# Patient Record
Sex: Female | Born: 1970 | Race: White | Hispanic: No | Smoking: Former smoker
Health system: Southern US, Community
[De-identification: ages and names within clinical notes are randomized; demographics above are authoritative.]

## PROBLEM LIST (undated history)

## (undated) DIAGNOSIS — R87619 Unspecified abnormal cytological findings in specimens from cervix uteri: Secondary | ICD-10-CM

## (undated) DIAGNOSIS — R413 Other amnesia: Secondary | ICD-10-CM

## (undated) DIAGNOSIS — R32 Unspecified urinary incontinence: Secondary | ICD-10-CM

## (undated) DIAGNOSIS — F419 Anxiety disorder, unspecified: Secondary | ICD-10-CM

## (undated) DIAGNOSIS — F324 Major depressive disorder, single episode, in partial remission: Secondary | ICD-10-CM

## (undated) DIAGNOSIS — F909 Attention-deficit hyperactivity disorder, unspecified type: Secondary | ICD-10-CM

## (undated) DIAGNOSIS — F840 Autistic disorder: Secondary | ICD-10-CM

## (undated) DIAGNOSIS — T7421XA Adult sexual abuse, confirmed, initial encounter: Secondary | ICD-10-CM

## (undated) DIAGNOSIS — F329 Major depressive disorder, single episode, unspecified: Secondary | ICD-10-CM

## (undated) DIAGNOSIS — D497 Neoplasm of unspecified behavior of endocrine glands and other parts of nervous system: Secondary | ICD-10-CM

## (undated) DIAGNOSIS — A770 Spotted fever due to Rickettsia rickettsii: Secondary | ICD-10-CM

## (undated) DIAGNOSIS — E039 Hypothyroidism, unspecified: Secondary | ICD-10-CM

## (undated) DIAGNOSIS — F411 Generalized anxiety disorder: Secondary | ICD-10-CM

## (undated) DIAGNOSIS — D649 Anemia, unspecified: Secondary | ICD-10-CM

## (undated) DIAGNOSIS — E079 Disorder of thyroid, unspecified: Secondary | ICD-10-CM

## (undated) DIAGNOSIS — F32A Depression, unspecified: Secondary | ICD-10-CM

## (undated) DIAGNOSIS — K900 Celiac disease: Secondary | ICD-10-CM

## (undated) HISTORY — DX: Anemia, unspecified: D64.9

## (undated) HISTORY — DX: Depression, unspecified: F32.A

## (undated) HISTORY — DX: Hypothyroidism, unspecified: E03.9

## (undated) HISTORY — DX: Major depressive disorder, single episode, in partial remission: F32.4

## (undated) HISTORY — DX: Other amnesia: R41.3

## (undated) HISTORY — DX: Disorder of thyroid, unspecified: E07.9

## (undated) HISTORY — DX: Spotted fever due to Rickettsia rickettsii: A77.0

## (undated) HISTORY — DX: Autistic disorder: F84.0

## (undated) HISTORY — DX: Neoplasm of unspecified behavior of endocrine glands and other parts of nervous system: D49.7

## (undated) HISTORY — DX: Unspecified abnormal cytological findings in specimens from cervix uteri: R87.619

## (undated) HISTORY — DX: Attention-deficit hyperactivity disorder, unspecified type: F90.9

## (undated) HISTORY — DX: Anxiety disorder, unspecified: F41.9

## (undated) HISTORY — DX: Unspecified urinary incontinence: R32

## (undated) HISTORY — DX: Generalized anxiety disorder: F41.1

## (undated) HISTORY — DX: Celiac disease: K90.0

## (undated) HISTORY — DX: Adult sexual abuse, confirmed, initial encounter: T74.21XA

## (undated) HISTORY — DX: Major depressive disorder, single episode, unspecified: F32.9

---
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
## 2003-05-31 HISTORY — PX: GYNECOLOGIC CRYOSURGERY: SHX857

## 2007-05-31 DIAGNOSIS — T7421XA Adult sexual abuse, confirmed, initial encounter: Secondary | ICD-10-CM

## 2007-05-31 HISTORY — DX: Adult sexual abuse, confirmed, initial encounter: T74.21XA

## 2017-03-07 DIAGNOSIS — K900 Celiac disease: Secondary | ICD-10-CM | POA: Insufficient documentation

## 2017-03-07 DIAGNOSIS — E038 Other specified hypothyroidism: Secondary | ICD-10-CM | POA: Insufficient documentation

## 2017-03-07 DIAGNOSIS — E063 Autoimmune thyroiditis: Secondary | ICD-10-CM | POA: Insufficient documentation

## 2017-04-13 DIAGNOSIS — M797 Fibromyalgia: Secondary | ICD-10-CM | POA: Insufficient documentation

## 2017-07-10 DIAGNOSIS — E559 Vitamin D deficiency, unspecified: Secondary | ICD-10-CM | POA: Insufficient documentation

## 2018-01-05 ENCOUNTER — Other Ambulatory Visit: Payer: Self-pay

## 2018-01-05 ENCOUNTER — Ambulatory Visit (INDEPENDENT_AMBULATORY_CARE_PROVIDER_SITE_OTHER): Payer: BLUE CROSS/BLUE SHIELD | Admitting: Obstetrics & Gynecology

## 2018-01-05 ENCOUNTER — Encounter: Payer: Self-pay | Admitting: Obstetrics & Gynecology

## 2018-01-05 ENCOUNTER — Other Ambulatory Visit (HOSPITAL_COMMUNITY)
Admission: RE | Admit: 2018-01-05 | Discharge: 2018-01-05 | Disposition: A | Discharge status: Home / Self Care | Payer: BLUE CROSS/BLUE SHIELD | Source: Ambulatory Visit | Attending: Obstetrics & Gynecology | Admitting: Obstetrics & Gynecology

## 2018-01-05 VITALS — BP 120/92 | HR 80 | Resp 16 | Ht 67.5 in | Wt 150.4 lb

## 2018-01-05 DIAGNOSIS — Z01419 Encounter for gynecological examination (general) (routine) without abnormal findings: Secondary | ICD-10-CM | POA: Diagnosis not present

## 2018-01-05 DIAGNOSIS — N6310 Unspecified lump in the right breast, unspecified quadrant: Secondary | ICD-10-CM | POA: Diagnosis not present

## 2018-01-05 DIAGNOSIS — Z202 Contact with and (suspected) exposure to infections with a predominantly sexual mode of transmission: Secondary | ICD-10-CM

## 2018-01-05 DIAGNOSIS — N920 Excessive and frequent menstruation with regular cycle: Secondary | ICD-10-CM

## 2018-01-05 DIAGNOSIS — Z205 Contact with and (suspected) exposure to viral hepatitis: Secondary | ICD-10-CM

## 2018-01-05 DIAGNOSIS — Z124 Encounter for screening for malignant neoplasm of cervix: Secondary | ICD-10-CM | POA: Insufficient documentation

## 2018-01-05 DIAGNOSIS — N852 Hypertrophy of uterus: Secondary | ICD-10-CM

## 2018-01-05 NOTE — Progress Notes (Signed)
Patient is scheduled for Bilateral Breast Diagnostic Mammogram and R Breast Ultrasound at [facility] on 01/10/18 at 1410 .  Requested female radiologist and mammography technician per patient request.
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
## 2018-01-06 LAB — HEPATITIS C ANTIBODY: HEP C VIRUS AB: 0.2 {s_co_ratio} (ref 0.0–0.9)

## 2018-01-06 LAB — HEP, RPR, HIV PANEL
HEP B S AG: NEGATIVE
HIV SCREEN 4TH GENERATION: NONREACTIVE
RPR: NONREACTIVE

## 2018-01-09 ENCOUNTER — Encounter: Payer: Self-pay | Admitting: Obstetrics & Gynecology

## 2018-01-09 LAB — CYTOLOGY - PAP
Chlamydia: NEGATIVE
Diagnosis: NEGATIVE
HPV: NOT DETECTED
Neisseria Gonorrhea: NEGATIVE
TRICH (WINDOWPATH): NEGATIVE

## 2018-01-10 ENCOUNTER — Ambulatory Visit
Admission: RE | Admit: 2018-01-10 | Discharge: 2018-01-10 | Disposition: A | Discharge status: Home / Self Care | Payer: BLUE CROSS/BLUE SHIELD | Source: Ambulatory Visit | Attending: Obstetrics & Gynecology | Admitting: Obstetrics & Gynecology

## 2018-01-10 ENCOUNTER — Other Ambulatory Visit: Payer: Self-pay | Admitting: Obstetrics & Gynecology

## 2018-01-10 DIAGNOSIS — N6310 Unspecified lump in the right breast, unspecified quadrant: Secondary | ICD-10-CM

## 2018-01-12 ENCOUNTER — Other Ambulatory Visit: Payer: BLUE CROSS/BLUE SHIELD

## 2018-07-16 ENCOUNTER — Other Ambulatory Visit: Payer: Self-pay | Admitting: Obstetrics & Gynecology

## 2018-07-16 ENCOUNTER — Ambulatory Visit
Admission: RE | Admit: 2018-07-16 | Discharge: 2018-07-16 | Disposition: A | Discharge status: Home / Self Care | Payer: BLUE CROSS/BLUE SHIELD | Source: Ambulatory Visit | Attending: Obstetrics & Gynecology | Admitting: Obstetrics & Gynecology

## 2018-07-16 DIAGNOSIS — N6310 Unspecified lump in the right breast, unspecified quadrant: Secondary | ICD-10-CM

## 2019-01-12 ENCOUNTER — Other Ambulatory Visit: Payer: Self-pay

## 2019-01-12 ENCOUNTER — Ambulatory Visit (HOSPITAL_COMMUNITY)
Admission: EM | Admit: 2019-01-12 | Discharge: 2019-01-12 | Disposition: A | Discharge status: Home / Self Care | Payer: BLUE CROSS/BLUE SHIELD | Source: Home / Self Care

## 2019-01-12 ENCOUNTER — Emergency Department (HOSPITAL_COMMUNITY)
Admission: EM | Admit: 2019-01-12 | Discharge: 2019-01-12 | Disposition: A | Discharge status: Home / Self Care | Payer: BLUE CROSS/BLUE SHIELD | Attending: Emergency Medicine | Admitting: Emergency Medicine

## 2019-01-12 ENCOUNTER — Emergency Department (HOSPITAL_COMMUNITY): Payer: BLUE CROSS/BLUE SHIELD

## 2019-01-12 DIAGNOSIS — Z5320 Procedure and treatment not carried out because of patient's decision for unspecified reasons: Secondary | ICD-10-CM | POA: Insufficient documentation

## 2019-01-12 DIAGNOSIS — Z79899 Other long term (current) drug therapy: Secondary | ICD-10-CM | POA: Diagnosis not present

## 2019-01-12 DIAGNOSIS — Z91040 Latex allergy status: Secondary | ICD-10-CM | POA: Diagnosis not present

## 2019-01-12 DIAGNOSIS — E038 Other specified hypothyroidism: Secondary | ICD-10-CM | POA: Insufficient documentation

## 2019-01-12 DIAGNOSIS — T7840XA Allergy, unspecified, initial encounter: Secondary | ICD-10-CM

## 2019-01-12 DIAGNOSIS — R0600 Dyspnea, unspecified: Secondary | ICD-10-CM | POA: Diagnosis present

## 2019-01-12 DIAGNOSIS — F410 Panic disorder [episodic paroxysmal anxiety] without agoraphobia: Secondary | ICD-10-CM | POA: Diagnosis not present

## 2019-01-12 DIAGNOSIS — R21 Rash and other nonspecific skin eruption: Secondary | ICD-10-CM | POA: Insufficient documentation

## 2019-01-12 MED ORDER — SODIUM CHLORIDE 0.9 % IV BOLUS: 1000.0000 mL | Freq: Once | INTRAVENOUS | Status: DC

## 2019-01-12 MED ORDER — DIPHENHYDRAMINE HCL 50 MG/ML IJ SOLN: 50.0000 mg | Freq: Once | INTRAMUSCULAR | Status: DC

## 2019-01-12 MED ORDER — LORAZEPAM 1 MG PO TABS: 1.0000 mg | ORAL_TABLET | Freq: Three times a day (TID) | ORAL | 0 refills | Status: DC | PRN

## 2019-01-12 MED ORDER — FAMOTIDINE IN NACL 20-0.9 MG/50ML-% IV SOLN: 20.0000 mg | Freq: Once | INTRAVENOUS | Status: DC

## 2019-01-12 MED ORDER — LORAZEPAM 2 MG/ML IJ SOLN: 1.0000 mg | Freq: Once | INTRAMUSCULAR | Status: DC

## 2019-01-12 NOTE — ED Notes (Addendum)
Pt brought to room via wheelchair from triage.  Pt crying and upset, Rolene Arbour, RN explained to pt that we needed to have her undress, pt st's she does not understand why.  This RN and Rolene Arbour RN explained to pt that we needed to put her on the cardiac monitor and get a IV started ref. Abnormal EKG and allergic reaction. We assured pt that we would provide privacy and held up a sheet while pt removed her shirt and bra.  When this nurse attempted to start IV pt st's she does not want it.  This RN explained to pt the importance of the IV and meds.  Pt again st's "I don't want it".  This RN asked pt if she was refusing and explained to pt that if she was that I could not do it against her will.  Pt again said I do not want it   At that time I spoke with Dr. Darl Householder and told him pt's wishes.  Dr. Darl Householder came into room and explained to pt that he thought she needed meds and would cancel lab work.  Pt stated to Dr. Darl Householder that she did not want it.  Dr. Darl Householder asked pt if he could at least give her meds via shot and not IV, pt again stated no.  Dr. Darl Householder then said he would suggest Benadryl, pt again said no.   I explained to pt that she would need to sign the AMA and pt agreed.  While removing blood pressure cuff and EKG leads, pt asked why we (the staff) was so pushy and pulling at her clothes, I explained to her that no one was pulling at her clothes and that this is the Emergency Dept and when someone like herself comes in with an allergic reaction and abnormal EKG we act very fast to ensure that we give her the best medical treatment that we can.  I assured her that her health was in our best interest and that we were only trying to care for her. I explained to her that I would go out and close the curtain so that she could get dressed.  When I went back into room to show her the way out, pt had already left the room

## 2019-01-12 NOTE — Discharge Instructions (Signed)
Take ativan as needed for panic attack   Take benadryl 25 mg every 6 hrs as needed for rash   See your doctor   Avoid vapes   You signed out against medical advice so we were unable to fully assess you   Return to ER if you have worse rash, shortness of breath, throat closing, chest pain
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
## 2019-01-12 NOTE — ED Provider Notes (Signed)
I initially was first to arrive to the room.  Patient was emergently placed back from waiting room for concerns of abnormal EKG and possible allergic reaction.  On my arrival, patient was being assisted to the bed by RN and tech.  Given concerns for possible abnormal EKG, staff was asked to place patient on cardiac monitor, which began immediately, while I reviewed her EKG.  EKG was given to emergency attending provider.  As I began to question patient regarding her history of present illness, she became very tearful and anxious and was not answering questions.  I attempted to gather further information from patient but was unsuccessful.  At this time, Dr. Darl Householder was at bedside and assumed care of patient.  I removed myself from room and did not participate in further care of this patient.   Portions of this note were generated with Lobbyist. Dictation errors may occur despite best attempts at proofreading.     Volanda Napoleon, PA-C 01/12/19 2137    Drenda Freeze, MD 01/12/19 2250
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
## 2019-01-12 NOTE — ED Notes (Signed)
Pt refusing all treatment st's she wants to go.  Dr. Darl Householder made aware and in to talk with pt.  Pt continues to refuse treatment.

## 2019-01-15 ENCOUNTER — Other Ambulatory Visit: Payer: BLUE CROSS/BLUE SHIELD

## 2019-05-03 ENCOUNTER — Telehealth: Payer: Self-pay

## 2019-05-03 ENCOUNTER — Encounter: Payer: Self-pay | Admitting: Obstetrics & Gynecology

## 2019-05-03 NOTE — Telephone Encounter (Signed)
Pt taken out of recall. Letter sent mailed and via [facility] per Dr Sabra Heck.  Will close encounter .

## 2019-05-03 NOTE — Telephone Encounter (Signed)
Tried calling patient regarding recall for breast imaging. No answer, and unable to leave a message as the patient's voicemail box is full.

## 2019-07-02 DIAGNOSIS — F4310 Post-traumatic stress disorder, unspecified: Secondary | ICD-10-CM | POA: Insufficient documentation

## 2019-10-16 ENCOUNTER — Encounter: Payer: Self-pay | Admitting: Obstetrics & Gynecology

## 2019-10-22 DIAGNOSIS — E782 Mixed hyperlipidemia: Secondary | ICD-10-CM | POA: Insufficient documentation

## 2019-10-24 LAB — COLOGUARD

## 2019-10-31 LAB — COLOGUARD: COLOGUARD: NEGATIVE

## 2019-10-31 LAB — EXTERNAL GENERIC LAB PROCEDURE: COLOGUARD: NEGATIVE
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
## 2019-11-11 ENCOUNTER — Other Ambulatory Visit: Payer: Self-pay

## 2019-11-12 ENCOUNTER — Telehealth: Payer: Self-pay | Admitting: *Deleted

## 2019-11-12 ENCOUNTER — Ambulatory Visit (INDEPENDENT_AMBULATORY_CARE_PROVIDER_SITE_OTHER): Payer: BC Managed Care – PPO | Admitting: Obstetrics & Gynecology

## 2019-11-12 ENCOUNTER — Encounter: Payer: Self-pay | Admitting: Obstetrics & Gynecology

## 2019-11-12 VITALS — BP 116/80 | HR 68 | Temp 97.0°F | Resp 16 | Ht 67.5 in | Wt 143.0 lb

## 2019-11-12 DIAGNOSIS — N921 Excessive and frequent menstruation with irregular cycle: Secondary | ICD-10-CM | POA: Diagnosis not present

## 2019-11-12 DIAGNOSIS — I8390 Asymptomatic varicose veins of unspecified lower extremity: Secondary | ICD-10-CM

## 2019-11-12 DIAGNOSIS — Z01419 Encounter for gynecological examination (general) (routine) without abnormal findings: Secondary | ICD-10-CM

## 2019-11-12 DIAGNOSIS — N6310 Unspecified lump in the right breast, unspecified quadrant: Secondary | ICD-10-CM

## 2019-11-12 NOTE — Telephone Encounter (Signed)
-----   Message from Megan Salon, MD sent at 11/12/2019 11:19 AM EDT ----- Regarding: follow up MMG Charlotte Jimenez, This pt did not go for her breast follow up.  She is ready to do it now.  Can you please call and see what needs to be ordered.  She does need bilateral screening and follow up.  Thanks.  Vinnie Level

## 2019-11-12 NOTE — Telephone Encounter (Signed)
Per review of Epic, right breast US on 07/16/18.  IMPRESSION: Stable likely benign complex cyst or clustered cysts in the INNER RIGHT breast.  RECOMMENDATION: RIGHT breast ultrasound at time of annual BILATERAL diagnostic mammography in 6 months.     Order placed for bilateral DX MMG and right breast US.   Call placed to [facility], spoke with [facility]. Patient scheduled for bilateral Dx MMG and right breast US, if needed, on 11/15/19 at 1:40pm, arrive at 1:20pm.   Placed in MMG hold.   Call placed to patient, left detailed message, ok per dpr, name identified on voicemail.  Advised of appt as seen above. Advised if any changes need to be made to appt, contact [facility] directly at [phone] to reschedule. Return call to office if any additional questions.   Routing to provider for final review. Patient is agreeable to disposition. Will close encounter.

## 2019-11-13 ENCOUNTER — Telehealth: Payer: Self-pay | Admitting: Obstetrics & Gynecology

## 2019-11-13 NOTE — Telephone Encounter (Signed)
Call to patient. Per DPR, OK to leave message on voicemail.   Left voicemail requesting a return call to [facility] to review benefits and schedule recommended Pelvic ultrasound with Felipa Emory, MD

## 2019-11-14 NOTE — Telephone Encounter (Signed)
Patient is returning call to [facility]. 

## 2019-11-14 NOTE — Telephone Encounter (Signed)
Spoke with patient regarding benefits for recommended ultrasound. Patient is aware that ultrasound is transvaginal. Patient acknowledges understanding of information presented. Patient is aware of cancellation policy. Patient scheduled appointment for 12/05/2019 at 0300PM with M. Edwinna Areola, MD. Encounter closed.

## 2019-11-14 NOTE — Telephone Encounter (Signed)
Call to patient. Per DPR, OK to leave message on voicemail.  Left voicemail requesting a return call to [facility] to review benefits and schedule recommended Pelvic ultrasound with Felipa Emory, MD

## 2019-11-15 ENCOUNTER — Other Ambulatory Visit: Payer: BLUE CROSS/BLUE SHIELD

## 2019-11-21 ENCOUNTER — Ambulatory Visit
Admission: RE | Admit: 2019-11-21 | Discharge: 2019-11-21 | Disposition: A | Discharge status: Home / Self Care | Payer: BLUE CROSS/BLUE SHIELD | Source: Ambulatory Visit | Attending: Obstetrics & Gynecology | Admitting: Obstetrics & Gynecology

## 2019-11-21 ENCOUNTER — Other Ambulatory Visit: Payer: Self-pay

## 2019-11-21 DIAGNOSIS — N6310 Unspecified lump in the right breast, unspecified quadrant: Secondary | ICD-10-CM

## 2019-12-05 ENCOUNTER — Ambulatory Visit (INDEPENDENT_AMBULATORY_CARE_PROVIDER_SITE_OTHER): Payer: BC Managed Care – PPO | Admitting: Obstetrics & Gynecology

## 2019-12-05 ENCOUNTER — Encounter: Payer: Self-pay | Admitting: Obstetrics & Gynecology

## 2019-12-05 ENCOUNTER — Other Ambulatory Visit: Payer: Self-pay

## 2019-12-05 ENCOUNTER — Ambulatory Visit (INDEPENDENT_AMBULATORY_CARE_PROVIDER_SITE_OTHER): Payer: BC Managed Care – PPO

## 2019-12-05 VITALS — BP 110/76 | HR 68 | Resp 16 | Wt 143.0 lb

## 2019-12-05 DIAGNOSIS — N921 Excessive and frequent menstruation with irregular cycle: Secondary | ICD-10-CM

## 2019-12-05 DIAGNOSIS — D582 Other hemoglobinopathies: Secondary | ICD-10-CM

## 2019-12-05 DIAGNOSIS — I8390 Asymptomatic varicose veins of unspecified lower extremity: Secondary | ICD-10-CM | POA: Diagnosis not present

## 2019-12-05 NOTE — Progress Notes (Signed)
49 y.o. [id number] Legally Separated White or Caucasian female here for pelvic ultrasound due to enlarged uterus and menorrhagia.  Pt really does not want to do anything for treatment of menorrhagia if possible.  Has hx of varicose veins of LEs that cause a lot of pain at times.  Desires to see provider for assessment and possible treatment.  Patient's last menstrual period was 11/21/2019 (exact date).  Contraception: not SA  Findings:  UTERUS: 8.7 x 5.8 x 5.1cm EMS: 9.45m ADNEXA: Left ovary: 3.4 x 1.7 x 1.6cm       Right ovary: 2.7 x 1.8 x 1.4cm with 1.1 x 14.5XMfollicle and irregular cystic structure within ovary, possible resolving corpus luteum  CUL DE SAC: no free fluid  Discussion:  Images reviewed with pt and findings discussed.  Ovary, specifically on right, discussed.  Feel structure is benign but due to irregular appearance, will repeat PUS in 3 months.  Without any additional endometrial finding, feel pt should proceed with endometrial biopsy.  She does not really want to do this so will plan to repeat PUS right after menses in 3 month.  If endometrium is not thin at that point, will plan biopsy at that time as well.  Order placed but pt is going to need to call with cycle onset for scheduling.    We have discussed possible iron infusion for treatment of anemia.  Pt had iron studies done as well.  These are not in Epic and she forgot to bring them today.  She is going to fax or send through [facility]  Assessment:  Menorrhagia with anemia Irregular cystic structure on right ovary Anemia LE varicose veins that cause pain  Plan:  Return for PUS after menses in 3 months.  If endometrium is not thin, plan endometrial biopsy as well.  Treatment options for bleeding discussed.  She does describe self as minimalist so does not want any hormonal therapy or surgical procedures. Pt is to get me copy of iron studies to see if there is indication for iron infusion. Referral to vascular surgeon  placed today at pt's request for specific office/provider  30 minutes spent with pt in discussion of follow up and options for treatment.

## 2019-12-08 ENCOUNTER — Encounter: Payer: Self-pay | Admitting: Obstetrics & Gynecology

## 2019-12-08 DIAGNOSIS — F902 Attention-deficit hyperactivity disorder, combined type: Secondary | ICD-10-CM | POA: Insufficient documentation

## 2019-12-08 DIAGNOSIS — D649 Anemia, unspecified: Secondary | ICD-10-CM | POA: Insufficient documentation

## 2019-12-08 DIAGNOSIS — D497 Neoplasm of unspecified behavior of endocrine glands and other parts of nervous system: Secondary | ICD-10-CM | POA: Insufficient documentation

## 2019-12-11 ENCOUNTER — Encounter: Payer: Self-pay | Admitting: Obstetrics & Gynecology

## 2019-12-12 ENCOUNTER — Telehealth: Payer: Self-pay

## 2019-12-12 NOTE — Telephone Encounter (Signed)
Attachments  [id number]  [id number]  [id number]  [id number]  [id number]   [facility] Pool I think this is what you asked for.

## 2019-12-12 NOTE — Telephone Encounter (Signed)
Patient was seen in office on 12/05/19.   Labs attached to MyChart message  printed and to Dr. Sabra Heck for review.

## 2020-01-02 NOTE — Telephone Encounter (Signed)
Can you let pt know that her labs showed low iron, low ferritin but she had no anemia.  Recommended 3 month recheck after she had been on supplemental iron.  She can do that here or with provider who did labs in May.  Should repeat prior to making any decisions about IV iron.  Thanks.

## 2020-01-03 NOTE — Telephone Encounter (Signed)
Left detailed message, ok per dpr, name identified on voicemail. Advised as seen below per Dr. Sabra Heck. Advised patient to return call to office to schedule lab appt if she desires to repeat labs at [facility] or if any additional questions.    Per review of labs in MyChart message, iron and ferritin collected on 10/16/19.   Encounter closed.

## 2020-03-05 ENCOUNTER — Telehealth: Payer: Self-pay

## 2020-03-05 DIAGNOSIS — N921 Excessive and frequent menstruation with irregular cycle: Secondary | ICD-10-CM

## 2020-03-05 NOTE — Telephone Encounter (Signed)
Patient started her cycle last night and calling to schedule ultrasound.

## 2020-03-05 NOTE — Telephone Encounter (Signed)
Spoke with pt. Pt states calling to say that she started her cycle last night 10/6. Pt states is suppose to have PUS, but didn't know what day of cycle or when after? Pt states has improved PMS sx this last month due to diet and exercise changes.  Pt advised will review with Dr Sabra Heck and return call to schedule PUS with possible EMB. Pt agreeable.   Routing to Dr Sabra Heck, Please advise on PUS and what day of cycle.      Per OV notes 12/05/19: Plan:  Return for PUS after menses in 3 months.  If endometrium is not thin, plan endometrial biopsy as well.  Treatment options for bleeding discussed.

## 2020-03-06 ENCOUNTER — Telehealth: Payer: Self-pay

## 2020-03-06 NOTE — Telephone Encounter (Signed)
Left detailed message. Pt to return call to triage RN to schedule PUS on 03/12/20 per Dr Sabra Heck.

## 2020-03-06 NOTE — Telephone Encounter (Signed)
Patient returned call

## 2020-03-06 NOTE — Telephone Encounter (Signed)
I'd like to do this right after her cycle ends so next week Thursday would be good for this.  Thanks.

## 2020-03-06 NOTE — Telephone Encounter (Signed)
Spoke with pt. Pt given recommendations per Dr Sabra Heck. Pt agreeable and verbalized understanding. Pt scheduled for PUS per Dr Sabra Heck on 10/14 at 0930 am and OV to follow. Pt agreeable to date and time of appt. Cancellation policy reviewed.  Cc: Rosa for precert  Orders placed  Encounter closed

## 2020-03-06 NOTE — Telephone Encounter (Signed)
Call placed to convey benefits for-------. Spoke with the patient and conveyed the benefits. Patient understands/agreeable with the benefits. Patient is aware of the cancellation policy. Appointment scheduled 03/12/20.

## 2020-03-12 ENCOUNTER — Encounter: Payer: Self-pay | Admitting: Obstetrics & Gynecology

## 2020-03-12 ENCOUNTER — Other Ambulatory Visit: Payer: Self-pay

## 2020-03-12 ENCOUNTER — Ambulatory Visit (INDEPENDENT_AMBULATORY_CARE_PROVIDER_SITE_OTHER): Payer: BC Managed Care – PPO

## 2020-03-12 ENCOUNTER — Other Ambulatory Visit (HOSPITAL_COMMUNITY)
Admission: RE | Admit: 2020-03-12 | Discharge: 2020-03-12 | Disposition: A | Discharge status: Home / Self Care | Payer: BC Managed Care – PPO | Source: Ambulatory Visit | Attending: Obstetrics & Gynecology | Admitting: Obstetrics & Gynecology

## 2020-03-12 ENCOUNTER — Ambulatory Visit (INDEPENDENT_AMBULATORY_CARE_PROVIDER_SITE_OTHER): Payer: BC Managed Care – PPO | Admitting: Obstetrics & Gynecology

## 2020-03-12 VITALS — BP 108/68 | HR 68 | Resp 16 | Wt 142.0 lb

## 2020-03-12 DIAGNOSIS — E611 Iron deficiency: Secondary | ICD-10-CM

## 2020-03-12 DIAGNOSIS — N920 Excessive and frequent menstruation with regular cycle: Secondary | ICD-10-CM | POA: Diagnosis present

## 2020-03-12 DIAGNOSIS — D351 Benign neoplasm of parathyroid gland: Secondary | ICD-10-CM | POA: Diagnosis not present

## 2020-03-12 DIAGNOSIS — N921 Excessive and frequent menstruation with irregular cycle: Secondary | ICD-10-CM

## 2020-03-12 NOTE — Addendum Note (Signed)
Addended by: Megan Salon on: 03/12/2020 03:14 PM   Modules accepted: Orders

## 2020-03-12 NOTE — Progress Notes (Signed)
49 y.o. Charlotte Jimenez Legally Separated White or Caucasian female here for pelvic ultrasound due to menorrhagia and thickened endoemtrium.  Pt just finished her cycle and is on the second day of no bleeding.  We discussed repeating her PUS to evaluate the endometrium.  She reports her fatigue is better.  She is on a natural iron supplement.  Has not had follow up iron level testing.  Last ferritin on 01/02/2020 was 5.    Separately, patient had thyroid ultrasound done in [place] about six months ago that showed a 13 x 48m nodule on the left lower thyroid with hypervascularity, consistent with an adenoma.  I wanted to make sure she'd had follow up for this.  States she's had trouble getting a referral.  PTH level was normal and was advised it should be biopsied.  She's uncomfortable with this.  D/w pt surgery referral.  She is very comfortable with this and referral will be placed to Dr. GHarlow Asa    Patient's last menstrual period was 03/04/2020 (exact date).  Contraception: abstinence  Findings:  UTERUS: 8.0 x 5.3 x 4.6cm EMS: 7.441m symmetric ADNEXA: Left ovary: 2.6 x 1.5 x 1.8cm with 11 x 1000LKeptated follicle       Right ovary: 2.3 x 1.5 x 1.6cm.  Both ovaries with normal blood flow CUL DE SAC: no free fluid  Discussion:  Ultrasonographer supervised.  Findings and images reviewed with pt.  Given endometrial thickness, proximity to menstrual cycle, feel should proceed with endometrial biopsy.  Consent obtained.  Procedure:  Speculum placed.  Cervix visualized and cleansed with betadine prep.  A single toothed tenaculum was applied to the anterior lip of the cervix.  Endometrial pipelle was advanced through the cervix into the endometrial cavity without difficulty.  Pipelle passed to 7cm.  Suction applied and pipelle removed with good tissue sample obtained.  Tenculum removed.  No bleeding noted.  Patient tolerated procedure well.  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 GerHarlow Asaaced regarding possible parathyroid adenoma.

## 2020-03-13 LAB — IRON,TIBC AND FERRITIN PANEL
Ferritin: 12 ng/mL — ABNORMAL LOW (ref 15–150)
Iron Saturation: 8 % — CL (ref 15–55)
Iron: 30 ug/dL (ref 27–159)
Total Iron Binding Capacity: 372 ug/dL (ref 250–450)
UIBC: 342 ug/dL (ref 131–425)

## 2020-03-13 LAB — CBC
Hematocrit: 39.5 % (ref 34.0–46.6)
Hemoglobin: 13.1 g/dL (ref 11.1–15.9)
MCH: 29.8 pg (ref 26.6–33.0)
MCHC: 33.2 g/dL (ref 31.5–35.7)
MCV: 90 fL (ref 79–97)
Platelets: 306 10*3/uL (ref 150–450)
RBC: 4.39 x10E6/uL (ref 3.77–5.28)
RDW: 12.1 % (ref 11.7–15.4)
WBC: 5.9 10*3/uL (ref 3.4–10.8)

## 2020-03-13 LAB — SPECIMEN STATUS REPORT

## 2020-03-13 LAB — SURGICAL PATHOLOGY

## 2020-03-13 LAB — CALCIUM: Calcium: 9.2 mg/dL (ref 8.7–10.2)

## 2020-04-10 NOTE — Telephone Encounter (Signed)
Patient had breast imaging 11/21/19. Okay to close encounter.

## 2020-04-27 ENCOUNTER — Other Ambulatory Visit: Payer: Self-pay | Admitting: General Surgery

## 2020-04-27 ENCOUNTER — Other Ambulatory Visit: Payer: Self-pay | Admitting: Surgery

## 2020-04-27 DIAGNOSIS — R221 Localized swelling, mass and lump, neck: Secondary | ICD-10-CM

## 2020-05-21 ENCOUNTER — Ambulatory Visit
Admission: RE | Admit: 2020-05-21 | Discharge: 2020-05-21 | Disposition: A | Discharge status: Home / Self Care | Payer: BC Managed Care – PPO | Source: Ambulatory Visit | Attending: Surgery | Admitting: Surgery

## 2020-05-21 DIAGNOSIS — R221 Localized swelling, mass and lump, neck: Secondary | ICD-10-CM

## 2020-10-14 ENCOUNTER — Encounter: Payer: Self-pay | Admitting: Neurology
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
## 2020-10-27 ENCOUNTER — Ambulatory Visit: Payer: Commercial Managed Care - PPO | Admitting: Neurology
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
## 2020-11-03 ENCOUNTER — Other Ambulatory Visit: Payer: Self-pay

## 2020-11-03 ENCOUNTER — Telehealth: Payer: Self-pay | Admitting: Neurology

## 2020-11-03 ENCOUNTER — Ambulatory Visit: Payer: Commercial Managed Care - PPO | Admitting: Neurology

## 2020-11-03 ENCOUNTER — Encounter: Payer: Self-pay | Admitting: Neurology

## 2020-11-03 VITALS — BP 138/96 | HR 93 | Ht 67.0 in | Wt 135.0 lb

## 2020-11-03 DIAGNOSIS — G43709 Chronic migraine without aura, not intractable, without status migrainosus: Secondary | ICD-10-CM

## 2020-11-03 DIAGNOSIS — G245 Blepharospasm: Secondary | ICD-10-CM | POA: Diagnosis not present

## 2020-11-03 DIAGNOSIS — G249 Dystonia, unspecified: Secondary | ICD-10-CM | POA: Diagnosis not present

## 2020-11-03 DIAGNOSIS — R259 Unspecified abnormal involuntary movements: Secondary | ICD-10-CM

## 2020-11-03 DIAGNOSIS — G255 Other chorea: Secondary | ICD-10-CM

## 2020-11-03 NOTE — Patient Instructions (Signed)
A referral to [facility] has been placed for your MRI someone will contact you directly to schedule your appt. They are located at [facility]. Please contact them directly by calling [phone] with any questions regarding your referral.   Benign Essential Blepharospasm, Adult Benign essential blepharospasm (BEB) is a nervous system condition that makes a person close his or her eyes without meaning to. Over time, episodes of this condition may gradually become more frequent and forceful, and eventually involve both eyes. This can make it hard for you to keep your eyes open and do activities such as watching television, driving, or reading. If this condition is not treated, the eyes may close forcefully for long periods of time. What are the causes? The exact cause of this condition is not known. The condition may be passed down through families with an abnormal gene. Symptoms of the condition may be triggered by:  The wind.  Sunlight or bright lights.  Noise.  Stress.  Fatigue.  Reading.  Driving.  Walking outside.  Air pollution.  Eye strain. What increases the risk? You are more likely to develop this condition if:  You are age 49 or older.  You have a family history of the condition.  You are female.  There are problems with the part of your brain that controls movements (basal ganglia).  You have a movement disorder called dystonia.  You have a history of eye diseases or trauma to the eye(s).  You take certain medicines, such as medicines that treat Parkinson disease. What are the signs or symptoms? The first symptom of this condition is frequent eye blinking or twitching that you cannot control. It may happen during the day and disappear at night. Other early symptoms include:  Eye dryness and irritation.  Eye irritation or pain from bright lights (photophobia). You may get temporary relief from your symptoms when you sing, yawn, chew, or  laugh. Later symptoms of this condition include:  Winking and squinting for longer than usual.  Muscle spasms in your tongue and jaw (Meigesyndrome).  Inability to keep your eyes open for long periods of time. Over time, symptoms may get stronger and last longer. How is this diagnosed? This condition is diagnosed based on your symptoms, your medical history, and a physical exam. How is this treated? There is no cure for this condition, but treatment can help with symptoms. Treatment options include:  Applying moisturizing eye drops (artificial tears) to the eye. These drops help to relieve eye irritation and dryness.  Getting an injection of botulinum toxin into the muscles that control eyelid movement. This treatment may need to be repeated every few months.  Taking medicines such as muscle relaxants and anti-anxiety medicines.  Having surgery to remove part of the eyelid muscles (myectomy). This may be done if injections of botulinum toxin do not work or they stop working.   Follow these instructions at home: Lifestyle  Wear tinted sunglasses that block UV (ultraviolet) light.  Wear eye protection outdoors on windy days.  Get enough sleep.  Try to manage or avoid stressful situations.  Do not watch TV or have screen time for long periods of time.  Avoid things that trigger your condition. General instructions  Learn as much as you can about your condition.  Work closely with your team of health care providers.  Use over-the-counter and prescription medicines, including eye drops, only as told by your health care provider.  Keep all follow-up visits as told by  your health care provider. This is important.  Do not drive if you are having an episode that affects how well you can see.  Keep your eyelids clean. Wash them daily with mild soap and water. This will help to prevent irritation and infection. Contact a health care provider if:  Your symptoms are not  controlled with treatment.  Your eyes are red, teary, or dry.  Your eyes droop.  You feel anxious or depressed. Get help right away if:  You cannot open your eyes. Summary  Benign essential blepharospasm (BEB) is a nervous system condition that makes a person close his or her eyes without meaning to.  The exact cause of this condition is not known. The condition may be passed down through families with an abnormal gene.  There is no cure for this condition, but treatment can help with symptoms. This information is not intended to replace advice given to you by your health care provider. Make sure you discuss any questions you have with your health care provider. Document Revised: 01/31/2020 Document Reviewed: 01/31/2020 Elsevier Patient Education  2021 [employer].

## 2020-11-03 NOTE — Telephone Encounter (Signed)
Lets see what her MRI shows and we can meet back together.  She was a little resistant to doing any workup and I had to virtually beg her just to do the MRI.  Agree that she may need some further labs but think that the diagnosis of blepharospasm/meige was good.  I'm sorry she was unhappy.

## 2020-11-04 NOTE — Telephone Encounter (Signed)
Called patient and left a message for a call back.  

## 2020-11-05 NOTE — Telephone Encounter (Signed)
Called patient and left a message for a call back.  

## 2020-11-05 NOTE — Telephone Encounter (Signed)
Patient called in and left a message returning Mahina's call

## 2020-11-06 NOTE — Telephone Encounter (Signed)
She is returning a call to [facility].

## 2020-11-06 NOTE — Telephone Encounter (Signed)
Called patient and left a message for a call back.  

## 2020-11-06 NOTE — Telephone Encounter (Signed)
Called patient back and she wanted to express her frustration with Dr. Carles Collet.  Patient stated that when she came in for her appointment she felt like Dr. Carles Collet did not listen at all and chose to stick to her own misunderstanding.   Patient stated that she was trying to explain explain to Dr. Carles Collet the timing of her symptoms. Patient stated that she doesn't want Dr. Carles Collet to focus on the blinking as it is one of my more mild symptoms. Patient stated that she was blinking more the day of her appointment because she was trying to make eye contact with Dr. Carles Collet and not appear to be rude, so her forcing eye contact made her blink more. Therefore, patient stated that the blinking isn't the first symptom and that she disagrees with the timeline Dr. Carles Collet put in her chart because her symptoms start in her cheeks and Jaw first.   Patient stated that the weird movement with her mouth causes her to choke and that she doesn't have a swallowing issues. Patient explained that the "suctioning" begins on the side back part of her tongue and if she has food in her mouth that "suctioning" will suck the food back into her windpipe causing her to choke. Therefore, patient stated that she does not have a swallowing issue and swallows just fine. It's just that the suctioning movement in her mouth causes her to choke.  Patient stated that she was asked if she sees the movements or twitching in her cheeks and Jaw and stated that she tried to explain to Dr. Carles Collet that she doesn't look at herself in the mirror so she doesn't see it. Patient stated that she doesn't spend looking at herself in the mirror but patient feels it and it moves. Patient said that she sees people staring at her in public when it happens so she knows it is visible.   Patient again explained that Dr. Carles Collet Choose to not listen and mocked her when she was telling her about the choking, and it was such a horrible experience. Patient stated that "Dr. Carles Collet responded in a way that  had nothing to do with what I just said and didn't like the recommendations about therapy for swallowing. She choose to ignore the fact that I corrected her and insisted that I have trouble swallowing food and that is not the case."   Also, patient stated that she was only reluctant to the MRI because doesn't like contrast dye. Patient stated that Dr,. Tat takes a couple of words and twist it up to nothing the patient said.  Patient stated that she originally wanted to See Dr. Delice Lesch but decided to give Dr. Carles Collet a chance regardless of the bad reviews she had. Patient stated that she should have listened to her instinct and not see Dr. Carles Collet. Patient stated that she was too uncomfortable to explain things to Dr. Carles Collet because she felt like she was not listening, so patient lost her confidence to communicate with her.   Before hanging up patient wanted to ensure that she wasn't fully blaming Dr. Carles Collet and that her forcing her eye contact with Dr. Carles Collet made her blink more so she could see why she thought the blinking was the first symptom when it isn't. Patient did mention she is looking for another doctor.  I apologized to patient for her bad experience and informed her that I would send Dr. Carles Collet a message and give her a call once I hear back. Patient is  aware that Dr. Carles Collet is on vacation and will be back on Monday.

## 2020-11-08 NOTE — Telephone Encounter (Signed)
I'm sorry she feels that way.  I tried to listen intently to what she was telling me.  I'm glad she still wants to pursue neuro care (even if elsewhere) as I feel that she needs that and, as mentioned, was going to do other things after her MRI.  Since she plans to seek care elsewhere and has lost trust in me unfortunately, will she still be pursuing the MRI?

## 2020-11-09 NOTE — Telephone Encounter (Signed)
MRI cancelled and noted.

## 2020-11-09 NOTE — Telephone Encounter (Signed)
I did speak with patient and thanked me for calling her back. She will pursue her care elsewhere, MRI will be done at the next Neurologist she goes to.

## 2020-11-09 NOTE — Telephone Encounter (Signed)
Voicemail at  [phone] 11/09/2020 will call back

## 2020-11-10 IMAGING — US US BREAST*R* LIMITED INC AXILLA
1 series · 6 of 6 positions shown · non-contrast
Comparison: Previous exam(s).

CLINICAL DATA: 49-year-old female for 2 year follow-up of probable
INNER RIGHT breast mass/cyst. Also for annual bilateral mammogram.

EXAM:
DIGITAL DIAGNOSTIC BILATERAL MAMMOGRAM WITH CAD AND TOMO
ULTRASOUND RIGHT BREAST

[Series 1: us breast*right* limited inc axilla · 0.05mm/px · 6 of 6 slices shown]
[im 1/6]
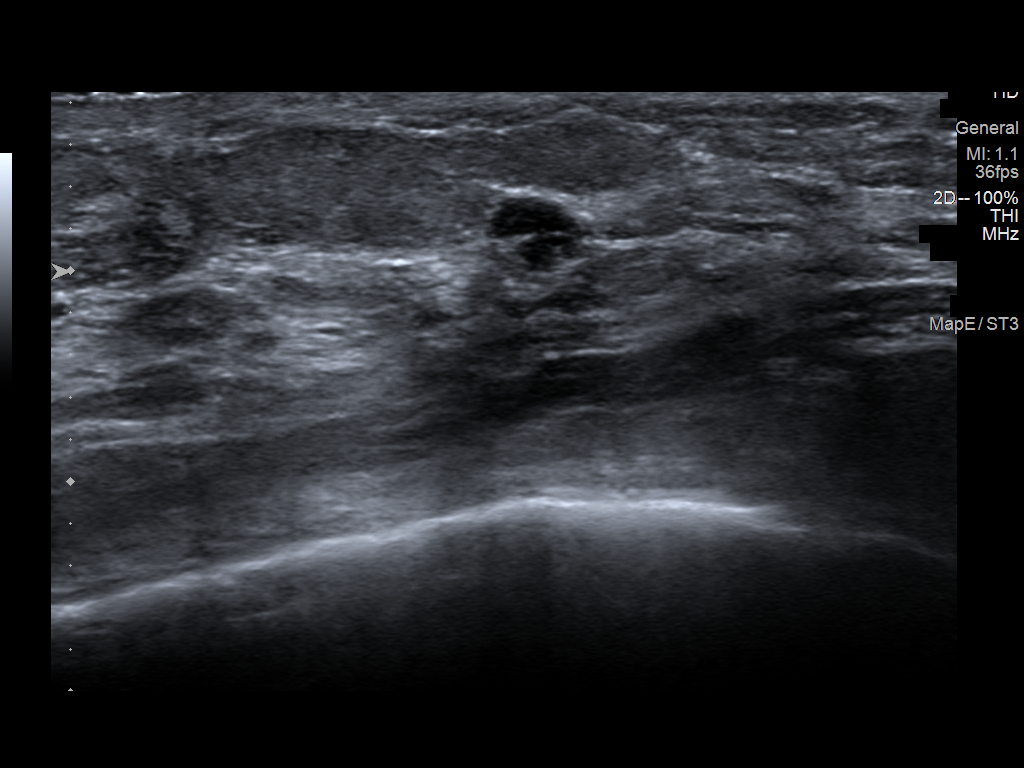
[im 2/6]
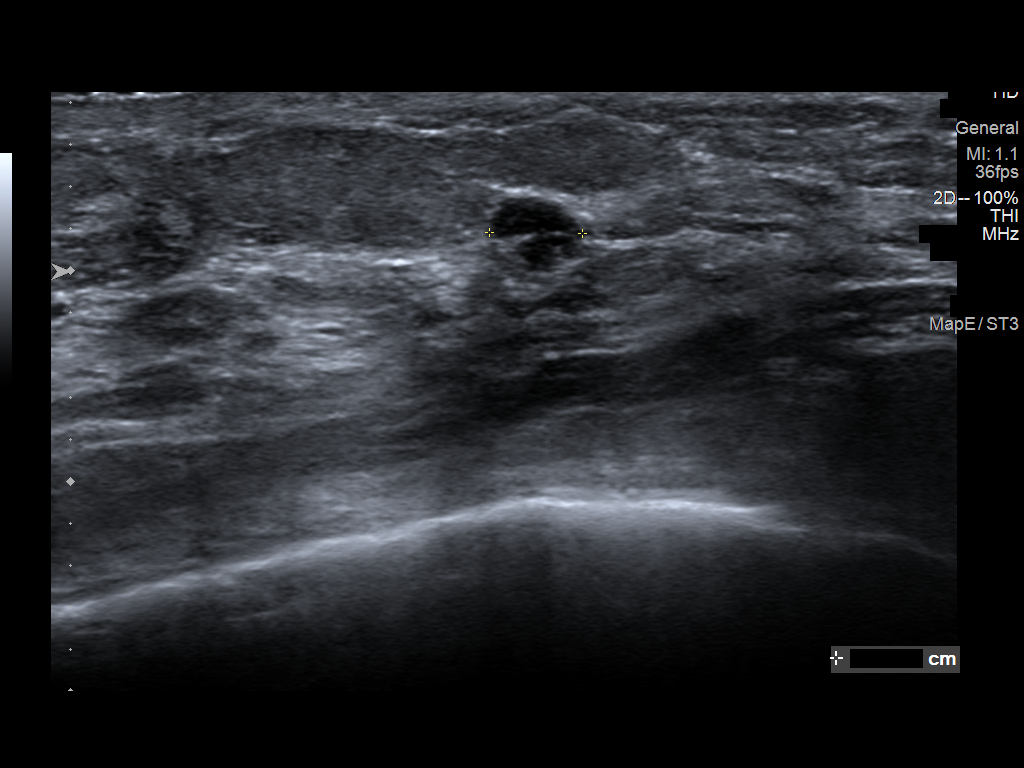
[im 3/6]
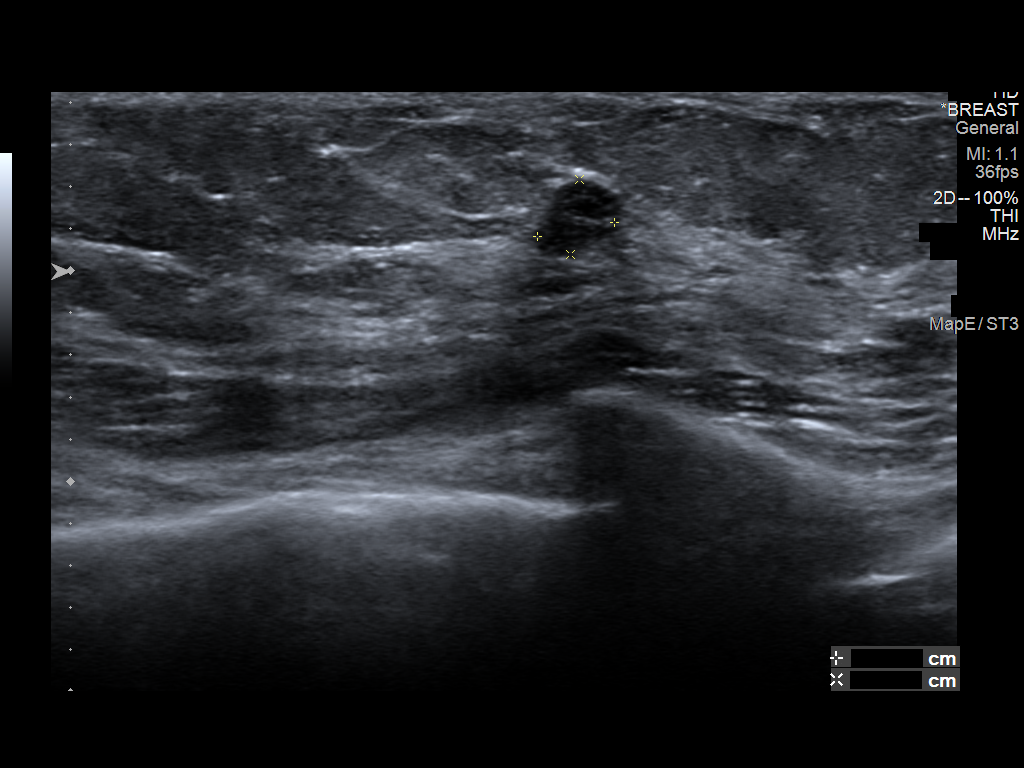
[im 4/6]
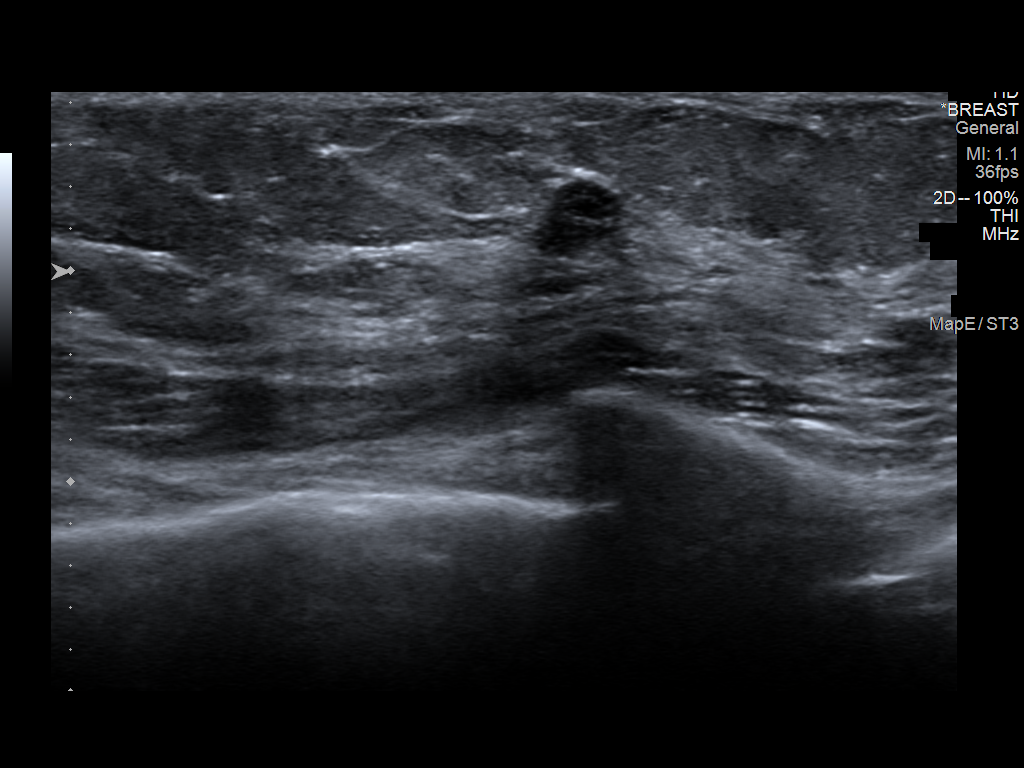
[im 5/6]
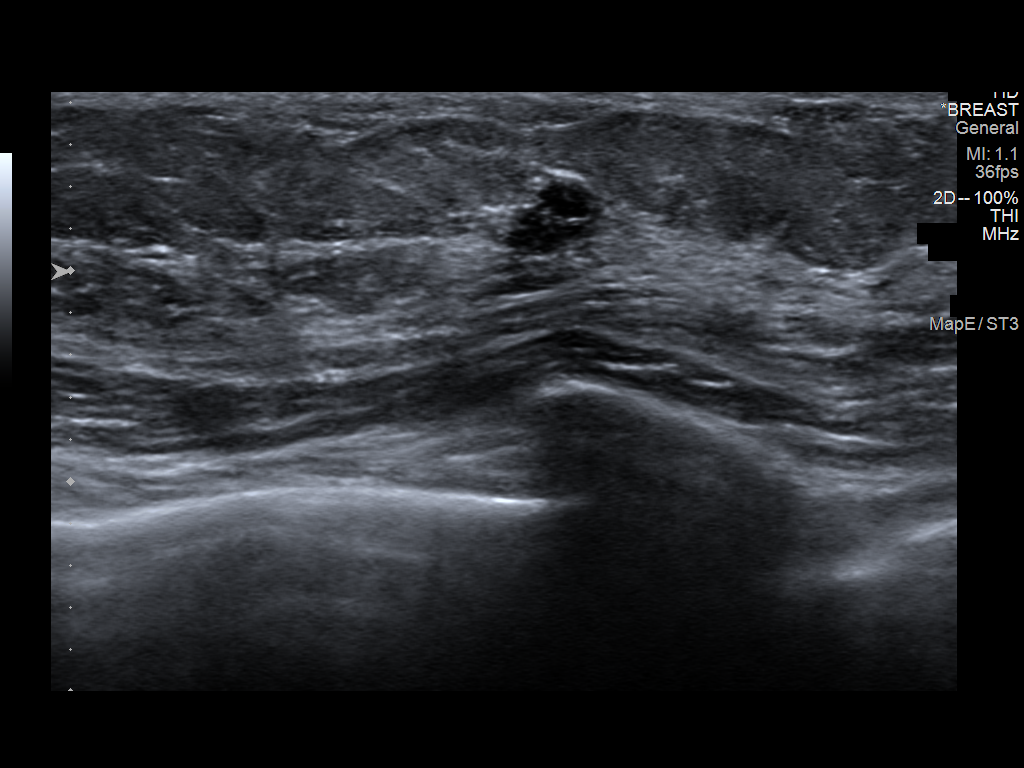
[im 6/6]
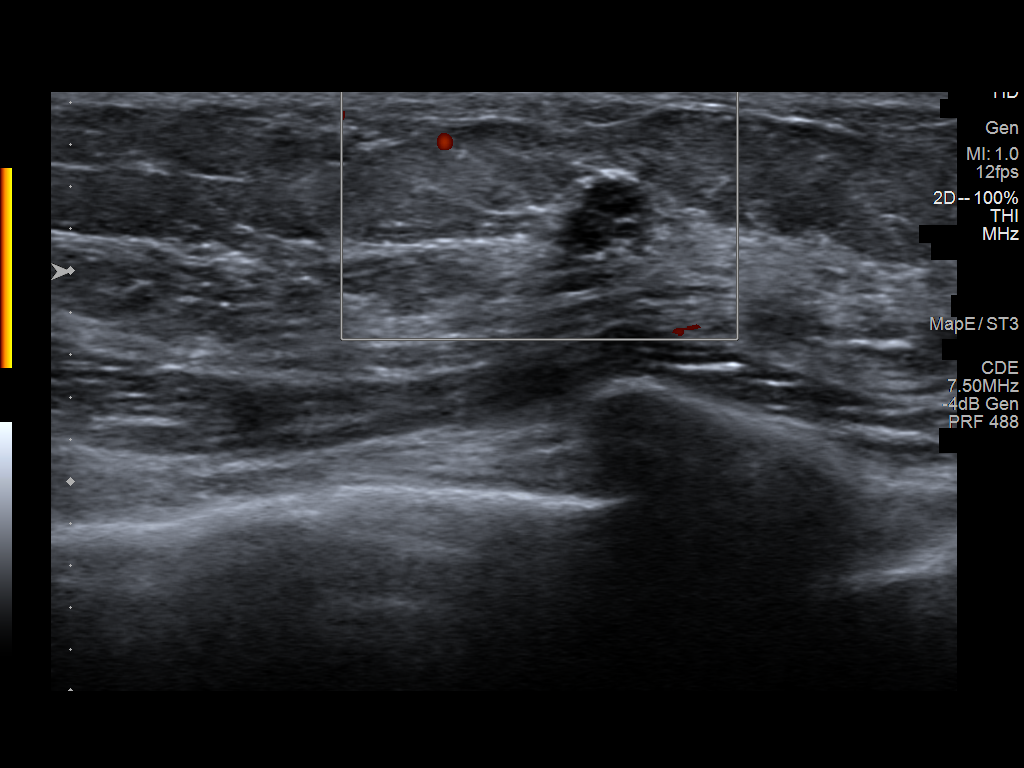

[6 of 6 positions shown; findings below may reference images not displayed]

ACR Breast Density Category c: The breast tissue is heterogeneously
dense, which may obscure small masses.
FINDINGS: 2D/3D full field views of both breasts demonstrate no suspicious
mass, distortion or worrisome calcifications. The previously
identified RIGHT breast masses are difficult to visualized today.

Mammographic images were processed with CAD.

Targeted ultrasound is performed, showing a 0.4 x 0.4 x 0.4 cm
circumscribed oval very hypoechoic mass at the 3 o'clock position of
the RIGHT breast 1 cm from the nipple, previously measuring 0.4 x
0.4 x 0.6 cm in 5510.
IMPRESSION: 1. Slightly decreased size of RIGHT breast mass/complicated cyst
compatible with a benign process.
2. No suspicious mammographic findings within either breast.

RECOMMENDATION:
Bilateral screening mammogram in 1 year.

I have discussed the findings and recommendations with the patient.
If applicable, a reminder letter will be sent to the patient
regarding the next appointment.

BI-RADS CATEGORY  2: Benign.

## 2020-11-11 ENCOUNTER — Encounter: Payer: Self-pay | Admitting: Neurology

## 2020-11-12 ENCOUNTER — Telehealth: Payer: Self-pay

## 2020-11-12 NOTE — Telephone Encounter (Signed)
MRI was cancelled and double checked with [facility] manager.

## 2020-11-18 ENCOUNTER — Telehealth: Payer: Self-pay | Admitting: Neurology

## 2020-11-18 NOTE — Telephone Encounter (Signed)
Patient dismissed from [facility] by Wells Guiles Tat, DO, effective 11/11/20. Dismissal Letter sent out by 1st class mail. [employer]

## 2021-01-14 ENCOUNTER — Ambulatory Visit: Payer: BC Managed Care – PPO

## 2021-05-11 IMAGING — US US THYROID
1 series · 13 of 25 positions shown · non-contrast
Comparison: None.

CLINICAL DATA: Other. History of Ferien[place] and left-sided thyroid
nodule.

EXAM:
THYROID ULTRASOUND
TECHNIQUE: Ultrasound examination of the thyroid gland and adjacent soft
tissues was performed.

[Series 1: us thyroid · 0.05mm/px · 13 of 38 slices shown]
[im 1/38]
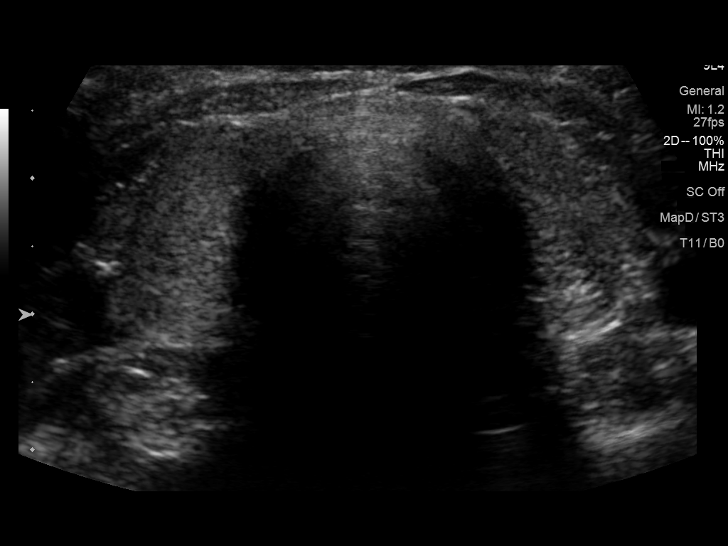
[im 4/38]
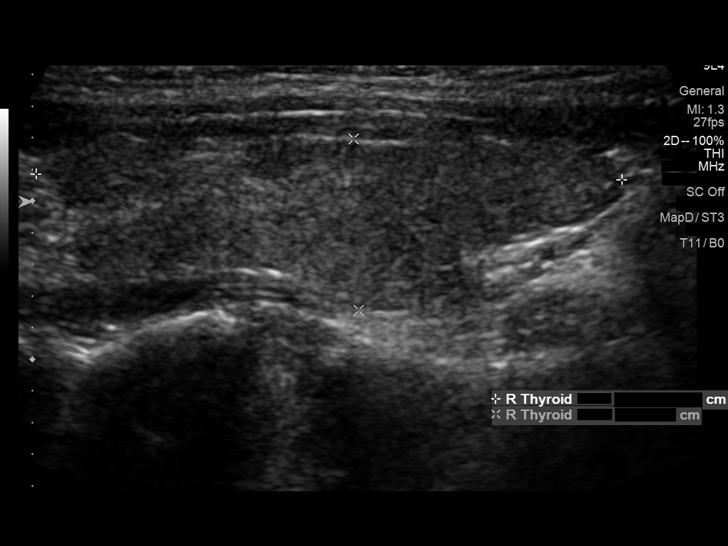
[im 7/38]
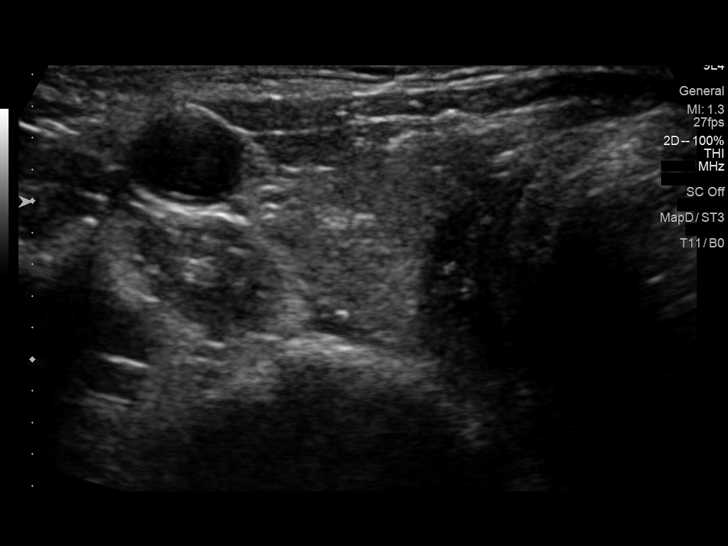
[im 10/38]
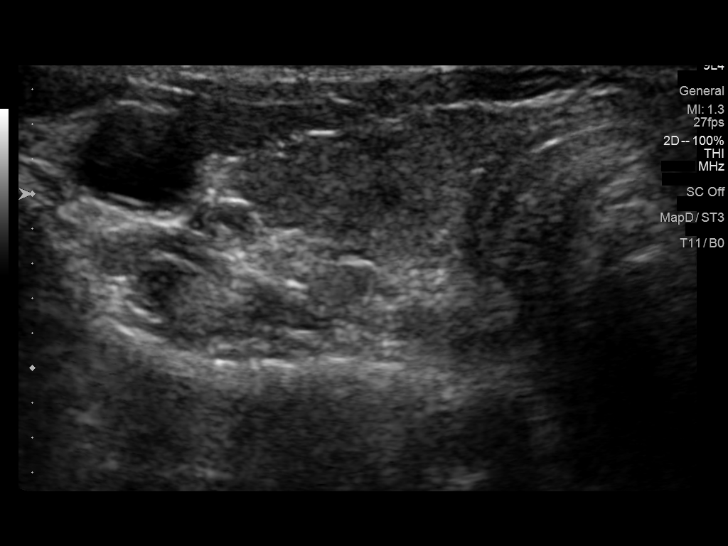
[im 13/38]
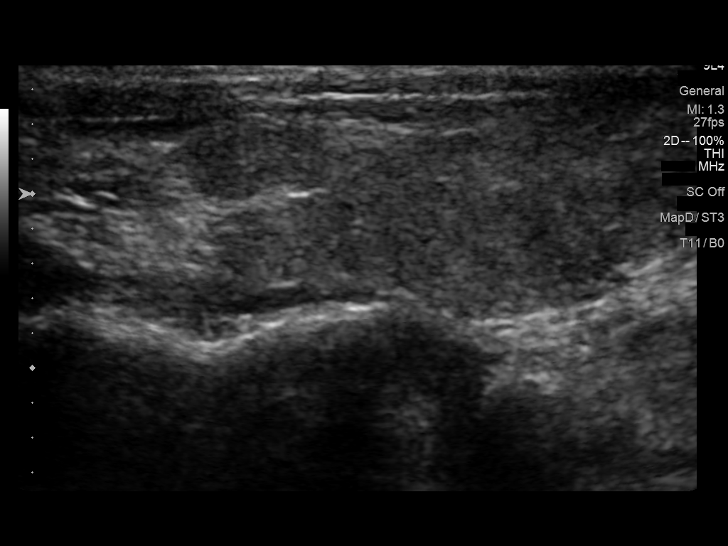
[im 16/38]
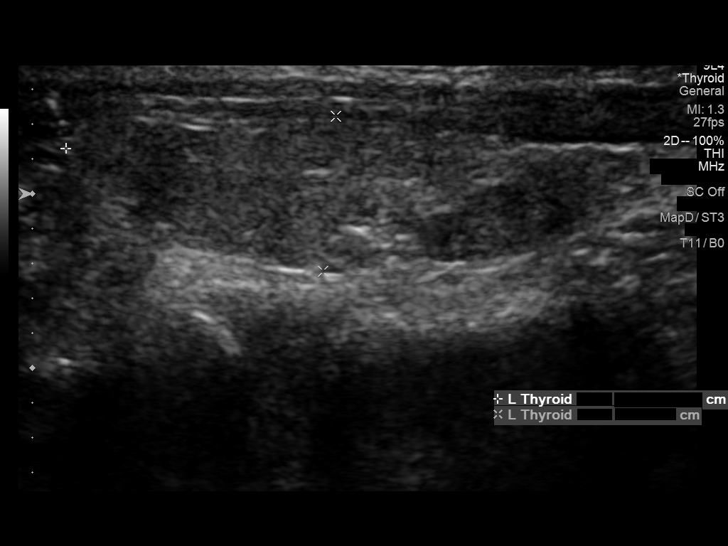
[im 19/38]
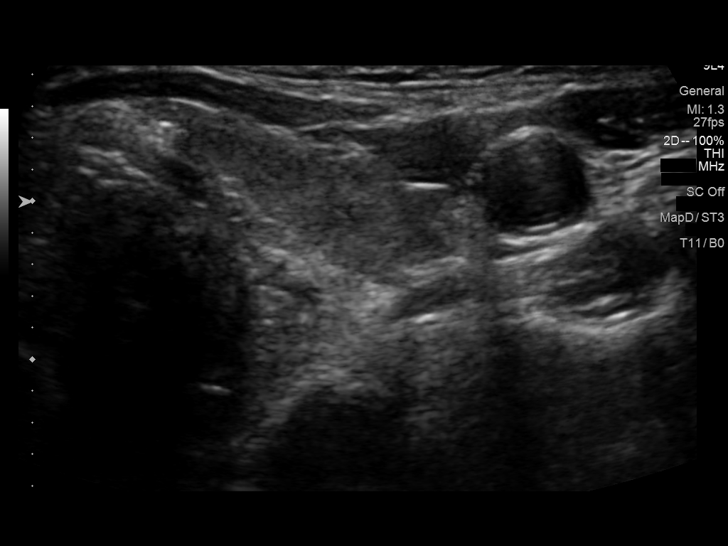
[im 22/38]
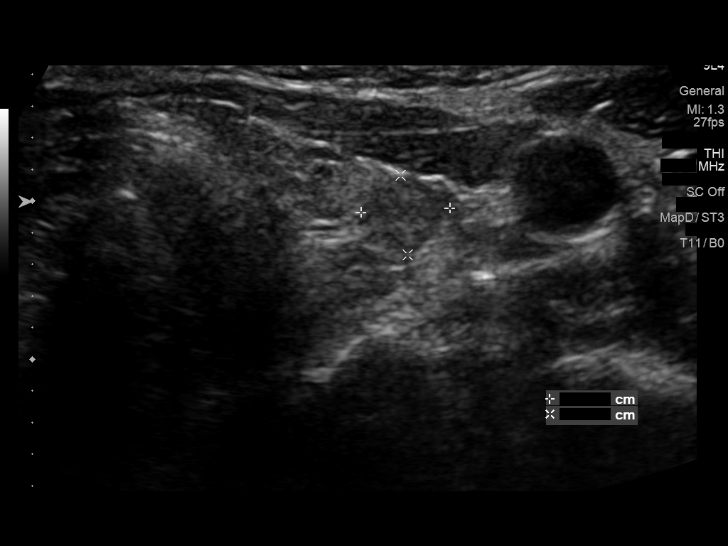
[im 25/38]
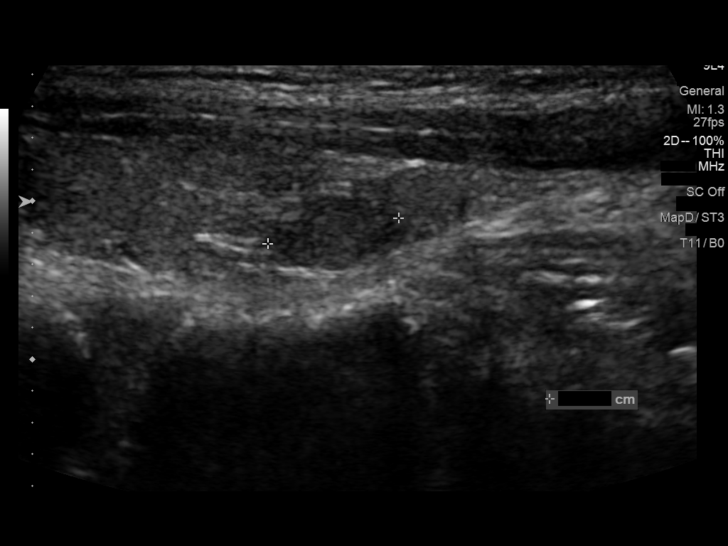
[im 28/38]
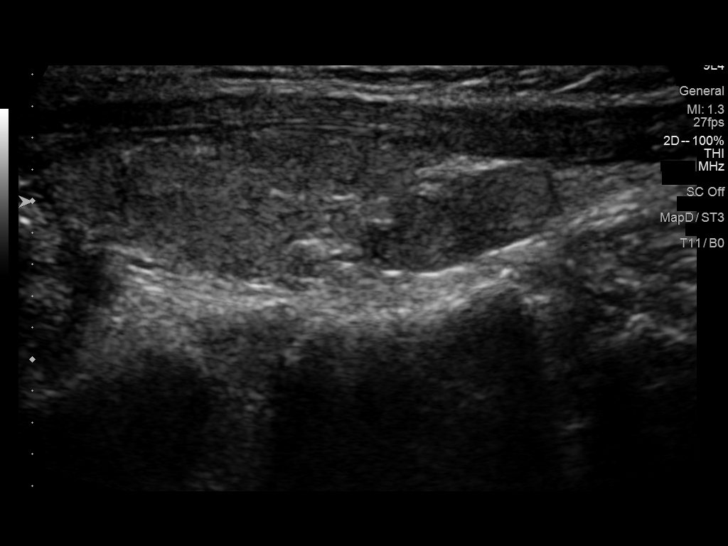
[im 31/38]
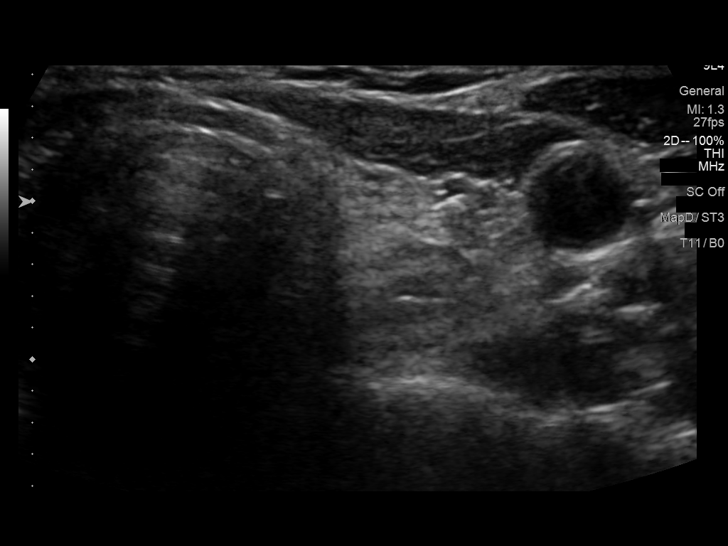
[im 34/38]
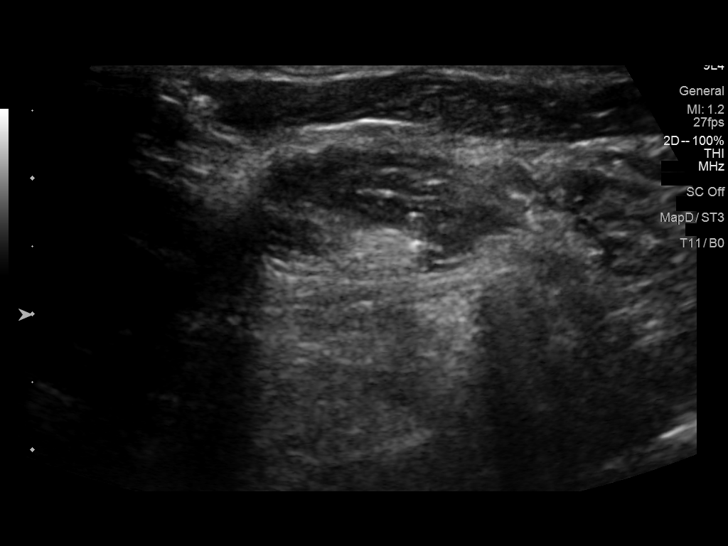
[im 38/38]
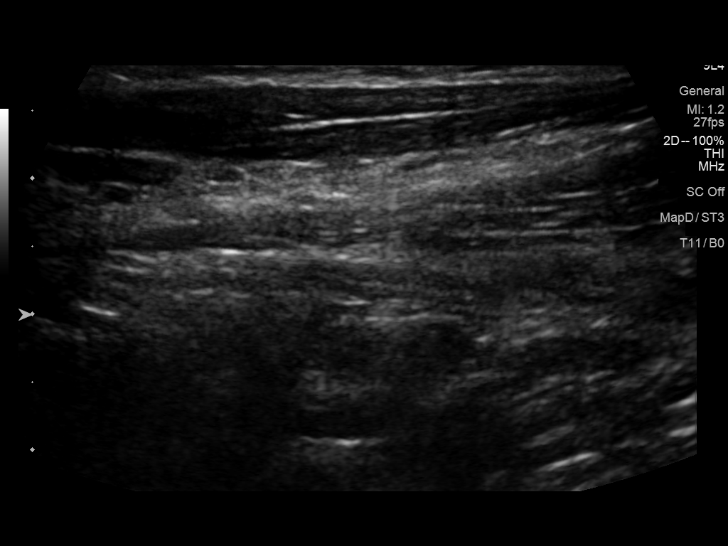

[13 of 25 positions shown; findings below may reference images not displayed]

FINDINGS: Parenchymal Echotexture: Moderately heterogenous - potential mild
diffuse glandular hyperemia (images 6 and 18)

Isthmus: Normal in size measuring 0.4 cm in diameter

Right lobe: Slightly atrophic in size measuring 3.7 x 1.7 x 1.5 cm

Left lobe: Slightly atrophic in size measuring 3.4 x 0.9 x 1.2 cm

_________________________________________________________

Estimated total number of nodules >/= 1 cm: 0

Number of spongiform nodules >/=  2 cm not described below (TR1): 0

Number of mixed cystic and solid nodules >/= 1.5 cm not described
below (TR2): 0

_________________________________________________________

There are two adjacent punctate (sub 0.8 cm) isoechoic ill-defined
nodules/pseudo nodules within the inferior pole the left lobe of the
thyroid, neither of which meet imaging criteria to recommend
percutaneous sampling or continued dedicated follow-up.
IMPRESSION: 1. Slightly atrophic, moderately heterogeneous and potentially
hyperemic thyroid without worrisome nodule or mass.
2. Punctate (sub 0.8 cm) isoechoic ill-defined nodules/pseudo
nodules within the left lobe of the thyroid do not meet criteria to
recommend percutaneous sampling or continued dedicated follow-up.

The above is in keeping with the ACR TI-RADS recommendations - [HOSPITAL] 2507;[DATE].

## 2021-10-07 ENCOUNTER — Ambulatory Visit (INDEPENDENT_AMBULATORY_CARE_PROVIDER_SITE_OTHER): Payer: Commercial Managed Care - PPO | Admitting: Obstetrics & Gynecology

## 2021-10-07 ENCOUNTER — Other Ambulatory Visit (HOSPITAL_COMMUNITY)
Admission: RE | Admit: 2021-10-07 | Discharge: 2021-10-07 | Disposition: A | Discharge status: Home / Self Care | Payer: Commercial Managed Care - PPO | Source: Ambulatory Visit | Attending: Obstetrics & Gynecology | Admitting: Obstetrics & Gynecology

## 2021-10-07 ENCOUNTER — Encounter (HOSPITAL_BASED_OUTPATIENT_CLINIC_OR_DEPARTMENT_OTHER): Payer: Self-pay | Admitting: Obstetrics & Gynecology

## 2021-10-07 VITALS — BP 148/88 | HR 85 | Ht 67.0 in | Wt 138.6 lb

## 2021-10-07 DIAGNOSIS — F324 Major depressive disorder, single episode, in partial remission: Secondary | ICD-10-CM | POA: Insufficient documentation

## 2021-10-07 DIAGNOSIS — Z124 Encounter for screening for malignant neoplasm of cervix: Secondary | ICD-10-CM

## 2021-10-07 DIAGNOSIS — Z8742 Personal history of other diseases of the female genital tract: Secondary | ICD-10-CM

## 2021-10-07 DIAGNOSIS — G43909 Migraine, unspecified, not intractable, without status migrainosus: Secondary | ICD-10-CM | POA: Insufficient documentation

## 2021-10-07 DIAGNOSIS — Z01419 Encounter for gynecological examination (general) (routine) without abnormal findings: Secondary | ICD-10-CM | POA: Diagnosis not present

## 2021-10-07 DIAGNOSIS — F840 Autistic disorder: Secondary | ICD-10-CM | POA: Insufficient documentation

## 2021-10-07 DIAGNOSIS — E038 Other specified hypothyroidism: Secondary | ICD-10-CM
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
## 2021-10-10 DIAGNOSIS — Z8742 Personal history of other diseases of the female genital tract: Secondary | ICD-10-CM | POA: Insufficient documentation

## 2021-10-11 LAB — CYTOLOGY - PAP
Comment: NEGATIVE
Diagnosis: NEGATIVE
High risk HPV: NEGATIVE

## 2021-11-04 ENCOUNTER — Ambulatory Visit: Payer: Commercial Managed Care - PPO | Admitting: Neurology

## 2022-01-12 ENCOUNTER — Ambulatory Visit (HOSPITAL_BASED_OUTPATIENT_CLINIC_OR_DEPARTMENT_OTHER): Payer: Commercial Managed Care - PPO

## 2022-01-12 ENCOUNTER — Other Ambulatory Visit (HOSPITAL_BASED_OUTPATIENT_CLINIC_OR_DEPARTMENT_OTHER): Payer: Self-pay | Admitting: Obstetrics & Gynecology

## 2022-01-12 ENCOUNTER — Other Ambulatory Visit (HOSPITAL_COMMUNITY)
Admission: RE | Admit: 2022-01-12 | Discharge: 2022-01-12 | Disposition: A | Discharge status: Home / Self Care | Payer: Commercial Managed Care - PPO | Source: Ambulatory Visit | Attending: Obstetrics & Gynecology | Admitting: Obstetrics & Gynecology

## 2022-01-12 DIAGNOSIS — Z113 Encounter for screening for infections with a predominantly sexual mode of transmission: Secondary | ICD-10-CM | POA: Diagnosis present

## 2022-01-12 NOTE — Progress Notes (Signed)
Patient came in today to submit an aptima swab for vaginal and oral  STD's. @ [facility] swab containers were sent off to the lab. tbw

## 2022-01-12 NOTE — Progress Notes (Signed)
Pt here for STD screening.  Desires oral testing as well.  Will test for GC/Chl orally and GC/Chl/trich vaginally.  Blood work orders placed as well today.

## 2022-01-13 LAB — CERVICOVAGINAL ANCILLARY ONLY
Chlamydia: NEGATIVE
Chlamydia: NEGATIVE
Comment: NEGATIVE
Comment: NORMAL
Comment: NEGATIVE
Comment: NEGATIVE
Comment: NORMAL
Neisseria Gonorrhea: NEGATIVE
Neisseria Gonorrhea: NEGATIVE
Trichomonas: NEGATIVE

## 2022-01-13 LAB — RPR+HBSAG+HIV
HIV Screen 4th Generation wRfx: NONREACTIVE
Hepatitis B Surface Ag: NEGATIVE
RPR Ser Ql: NONREACTIVE

## 2022-01-13 LAB — SPECIMEN STATUS REPORT

## 2022-01-13 LAB — HEPATITIS C ANTIBODY: Hep C Virus Ab: NONREACTIVE

## 2022-04-25 ENCOUNTER — Telehealth (HOSPITAL_BASED_OUTPATIENT_CLINIC_OR_DEPARTMENT_OTHER): Payer: Self-pay | Admitting: Obstetrics & Gynecology

## 2022-04-25 NOTE — Telephone Encounter (Signed)
Pt called stating that she has been experiencing some bleeding after intercourse for the past few months. Appt provided for evaluation by provider.

## 2022-04-25 NOTE — Telephone Encounter (Signed)
Patient called and stated she needs to be seen right away she is bleeding when she is having sex.

## 2022-04-26 ENCOUNTER — Encounter (HOSPITAL_BASED_OUTPATIENT_CLINIC_OR_DEPARTMENT_OTHER): Payer: Self-pay | Admitting: Obstetrics & Gynecology

## 2022-04-26 ENCOUNTER — Other Ambulatory Visit (HOSPITAL_COMMUNITY)
Admission: RE | Admit: 2022-04-26 | Discharge: 2022-04-26 | Disposition: A | Discharge status: Home / Self Care | Payer: Commercial Managed Care - PPO | Source: Ambulatory Visit | Attending: Obstetrics & Gynecology | Admitting: Obstetrics & Gynecology

## 2022-04-26 ENCOUNTER — Ambulatory Visit (INDEPENDENT_AMBULATORY_CARE_PROVIDER_SITE_OTHER): Payer: Commercial Managed Care - PPO | Admitting: Obstetrics & Gynecology

## 2022-04-26 VITALS — BP 132/88 | Ht 67.0 in | Wt 138.6 lb

## 2022-04-26 DIAGNOSIS — N930 Postcoital and contact bleeding: Secondary | ICD-10-CM | POA: Diagnosis present
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
## 2022-04-28 LAB — CERVICOVAGINAL ANCILLARY ONLY
Bacterial Vaginitis (gardnerella): NEGATIVE
Candida Glabrata: NEGATIVE
Candida Vaginitis: NEGATIVE
Comment: NEGATIVE
Comment: NEGATIVE
Comment: NEGATIVE

## 2022-04-29 NOTE — Addendum Note (Signed)
Addended by: Megan Salon on: 04/29/2022 01:16 PM   Modules accepted: Level of Service

## 2022-05-04 ENCOUNTER — Ambulatory Visit (HOSPITAL_BASED_OUTPATIENT_CLINIC_OR_DEPARTMENT_OTHER)
Admission: RE | Admit: 2022-05-04 | Discharge: 2022-05-04 | Disposition: A | Discharge status: Home / Self Care | Payer: Commercial Managed Care - PPO | Source: Ambulatory Visit | Attending: Obstetrics & Gynecology | Admitting: Obstetrics & Gynecology

## 2022-05-04 DIAGNOSIS — N930 Postcoital and contact bleeding: Secondary | ICD-10-CM | POA: Diagnosis not present

## 2022-05-09 ENCOUNTER — Telehealth (HOSPITAL_BASED_OUTPATIENT_CLINIC_OR_DEPARTMENT_OTHER): Payer: Self-pay

## 2022-05-09 NOTE — Telephone Encounter (Signed)
Patient states that the vaginal bleeding that she was experiencing has completely resolved. Patient is wanting to know if she really needs to get the vitamin E suppositories. She states that the initial appointment was for her vaginal bleeding. Please advise.   Patient was told and understands that Dr. Sabra Heck is on vacation. She is ok with waiting to be contacting once Dr. Sabra Heck returns. tbw

## 2022-05-25 NOTE — Telephone Encounter (Signed)
Called to follow up with patient about the below conversation. Patient states that she and her partner figured out what may have been causing the bleeding. It seemed to be one certain position that they were engaging in. They stopped using that position and the bleeding stopped. tbw

## 2022-10-13 ENCOUNTER — Encounter (HOSPITAL_BASED_OUTPATIENT_CLINIC_OR_DEPARTMENT_OTHER): Payer: Self-pay | Admitting: Obstetrics & Gynecology

## 2022-10-13 ENCOUNTER — Ambulatory Visit (INDEPENDENT_AMBULATORY_CARE_PROVIDER_SITE_OTHER): Payer: Commercial Managed Care - PPO | Admitting: Obstetrics & Gynecology

## 2022-10-13 ENCOUNTER — Other Ambulatory Visit (HOSPITAL_COMMUNITY)
Admission: RE | Admit: 2022-10-13 | Discharge: 2022-10-13 | Disposition: A | Discharge status: Home / Self Care | Payer: Commercial Managed Care - PPO | Source: Ambulatory Visit | Attending: Obstetrics & Gynecology | Admitting: Obstetrics & Gynecology

## 2022-10-13 VITALS — BP 150/92 | HR 97 | Ht 67.0 in | Wt 143.6 lb

## 2022-10-13 DIAGNOSIS — Z124 Encounter for screening for malignant neoplasm of cervix: Secondary | ICD-10-CM | POA: Diagnosis present

## 2022-10-13 DIAGNOSIS — Z1211 Encounter for screening for malignant neoplasm of colon: Secondary | ICD-10-CM

## 2022-10-13 DIAGNOSIS — N926 Irregular menstruation, unspecified: Secondary | ICD-10-CM | POA: Diagnosis not present

## 2022-10-13 DIAGNOSIS — Z01419 Encounter for gynecological examination (general) (routine) without abnormal findings: Secondary | ICD-10-CM | POA: Diagnosis not present

## 2022-10-13 DIAGNOSIS — Z113 Encounter for screening for infections with a predominantly sexual mode of transmission: Secondary | ICD-10-CM | POA: Insufficient documentation

## 2022-10-13 DIAGNOSIS — R030 Elevated blood-pressure reading, without diagnosis of hypertension: Secondary | ICD-10-CM
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
## 2022-10-14 LAB — FOLLICLE STIMULATING HORMONE: FSH: 71.3 m[IU]/mL

## 2022-10-14 LAB — HEPATITIS C ANTIBODY: Hep C Virus Ab: NONREACTIVE

## 2022-10-14 LAB — RPR+HBSAG+HIV
HIV Screen 4th Generation wRfx: NONREACTIVE
Hepatitis B Surface Ag: NEGATIVE
RPR Ser Ql: NONREACTIVE

## 2022-10-14 LAB — BETA HCG QUANT (REF LAB): hCG Quant: <1 m[IU]/mL

## 2022-10-21 LAB — CYTOLOGY - PAP
Chlamydia: NEGATIVE
Comment: NEGATIVE
Comment: NEGATIVE
Comment: NEGATIVE
Comment: NORMAL
High risk HPV: NEGATIVE
Neisseria Gonorrhea: NEGATIVE
Trichomonas: NEGATIVE

## 2023-10-20 ENCOUNTER — Encounter (HOSPITAL_BASED_OUTPATIENT_CLINIC_OR_DEPARTMENT_OTHER): Payer: Self-pay | Admitting: Obstetrics & Gynecology

## 2023-10-20 ENCOUNTER — Ambulatory Visit (HOSPITAL_BASED_OUTPATIENT_CLINIC_OR_DEPARTMENT_OTHER): Payer: Commercial Managed Care - PPO | Admitting: Obstetrics & Gynecology

## 2023-10-20 VITALS — BP 138/99 | HR 77 | Ht 67.0 in | Wt 149.2 lb

## 2023-10-20 DIAGNOSIS — R87612 Low grade squamous intraepithelial lesion on cytologic smear of cervix (LGSIL): Secondary | ICD-10-CM

## 2023-10-20 NOTE — Progress Notes (Unsigned)
   ANNUAL EXAM Patient name: Charlotte Jimenez MRN [id number]  Date of birth: 1970/07/05 Chief Complaint:   AEX  History of Present Illness:   Charlotte [place]  Jimenez is a 53 y.o. [phone] Caucasian female being seen today for a routine annual exam.   No LMP recorded.  Last pap  10/13/2022. Results were: {Pap findings:25134}. H/O abnormal pap: {yes/yes***/no:23866} Last mammogram: 11/21/2019 . Results were: {normal, abnormal, n/a:23837}. Family h/o breast cancer: {yes***/no:23838} Last colonoscopy: 10/24/2019  . Results were: {normal, abnormal, n/a:23837}. Family h/o colorectal cancer: {yes***/no:23838}     10/13/2022   11:32 AM 04/26/2022    4:43 PM 10/07/2021   11:21 AM  Depression screen PHQ 2/9  Decreased Interest 0 0 0  Down, Depressed, Hopeless 0 0 0  PHQ - 2 Score 0 0 0         No data to display           Review of Systems:   Pertinent items are noted in HPI Denies any headaches, blurred vision, fatigue, shortness of breath, chest pain, abdominal pain, abnormal vaginal discharge/itching/odor/irritation, problems with periods, bowel movements, urination, or intercourse unless otherwise stated above. Pertinent History Reviewed:  Reviewed past medical,surgical, social and family history.  Reviewed problem list, medications and allergies. Physical Assessment:  There were no vitals filed for this visit.There is no height or weight on file to calculate BMI.        Physical Examination:   General appearance - well appearing, and in no distress  Mental status - alert, oriented to person, place, and time  Psych:  She has a normal mood and affect  Skin - warm and dry, normal color, no suspicious lesions noted  Chest - effort normal, all lung fields clear to auscultation bilaterally  Heart - normal rate and regular rhythm  Neck:  midline trachea, no thyromegaly or nodules  Breasts - breasts appear normal, no suspicious masses, no skin or nipple changes or   axillary nodes  Abdomen - soft, nontender, nondistended, no masses or organomegaly  Pelvic - VULVA: normal appearing vulva with no masses, tenderness or lesions  VAGINA: normal appearing vagina with normal color and discharge, no lesions  CERVIX: normal appearing cervix without discharge or lesions, no CMT  Thin prep pap is {Desc; done/not:10129} *** HR HPV cotesting  UTERUS: uterus is felt to be normal size, shape, consistency and nontender   ADNEXA: No adnexal masses or tenderness noted.  Rectal - normal rectal, good sphincter tone, no masses felt. Hemoccult: ***  Extremities:  No swelling or varicosities noted  Chaperone present for exam  No results found for this or any previous visit (from the past 24 hours).  Assessment & Plan:  1) Well-Woman Exam  2) ***  Labs/procedures today: ***  Mammogram: {Mammo f/u:25212::"@ 53yo"}, or sooner if problems Colonoscopy: {TCS f/u:25213::"@ 53yo"}, or sooner if problems  No orders of the defined types were placed in this encounter.   Meds: No orders of the defined types were placed in this encounter.   Follow-up: No follow-ups on file.  SHERIE  MURPHY, CMA 10/20/2023 9:09 AM

## 2024-10-24 ENCOUNTER — Ambulatory Visit (HOSPITAL_BASED_OUTPATIENT_CLINIC_OR_DEPARTMENT_OTHER): Payer: Self-pay | Admitting: Obstetrics & Gynecology
